# Patient Record
Sex: Male | Born: 1935 | Race: White | Hispanic: No | Marital: Married | Smoking: Former smoker
Health system: Southern US, Community
[De-identification: ages and names within clinical notes are randomized; demographics above are authoritative.]

## PROBLEM LIST (undated history)

## (undated) DIAGNOSIS — N189 Chronic kidney disease, unspecified: Secondary | ICD-10-CM

## (undated) DIAGNOSIS — K922 Gastrointestinal hemorrhage, unspecified: Secondary | ICD-10-CM

## (undated) DIAGNOSIS — E785 Hyperlipidemia, unspecified: Secondary | ICD-10-CM

## (undated) DIAGNOSIS — K4090 Unilateral inguinal hernia, without obstruction or gangrene, not specified as recurrent: Secondary | ICD-10-CM

## (undated) DIAGNOSIS — I2510 Atherosclerotic heart disease of native coronary artery without angina pectoris: Secondary | ICD-10-CM

## (undated) DIAGNOSIS — I498 Other specified cardiac arrhythmias: Secondary | ICD-10-CM

## (undated) DIAGNOSIS — D509 Iron deficiency anemia, unspecified: Secondary | ICD-10-CM

## (undated) HISTORY — DX: Iron deficiency anemia, unspecified: D50.9

## (undated) HISTORY — DX: Hyperlipidemia, unspecified: E78.5

## (undated) HISTORY — DX: Chronic kidney disease, unspecified: N18.9

## (undated) HISTORY — DX: Unilateral inguinal hernia, without obstruction or gangrene, not specified as recurrent: K40.90

## (undated) HISTORY — DX: Atherosclerotic heart disease of native coronary artery without angina pectoris: I25.10

## (undated) HISTORY — DX: Gastrointestinal hemorrhage, unspecified: K92.2

## (undated) HISTORY — DX: Other specified cardiac arrhythmias: I49.8

---

## 2004-04-03 LAB — HM SIGMOIDOSCOPY

## 2006-09-27 ENCOUNTER — Ambulatory Visit (HOSPITAL_COMMUNITY): Admission: RE | Admit: 2006-09-27 | Discharge: 2006-09-27 | Payer: Self-pay | Admitting: Ophthalmology

## 2008-09-01 LAB — HM COLONOSCOPY

## 2010-06-23 ENCOUNTER — Encounter: Payer: Self-pay | Admitting: Family Medicine

## 2010-06-23 DIAGNOSIS — K922 Gastrointestinal hemorrhage, unspecified: Secondary | ICD-10-CM | POA: Insufficient documentation

## 2010-06-23 DIAGNOSIS — K4090 Unilateral inguinal hernia, without obstruction or gangrene, not specified as recurrent: Secondary | ICD-10-CM | POA: Insufficient documentation

## 2010-06-23 DIAGNOSIS — N400 Enlarged prostate without lower urinary tract symptoms: Secondary | ICD-10-CM | POA: Insufficient documentation

## 2010-06-23 DIAGNOSIS — I498 Other specified cardiac arrhythmias: Secondary | ICD-10-CM | POA: Insufficient documentation

## 2010-06-23 DIAGNOSIS — I2510 Atherosclerotic heart disease of native coronary artery without angina pectoris: Secondary | ICD-10-CM

## 2010-06-23 DIAGNOSIS — E785 Hyperlipidemia, unspecified: Secondary | ICD-10-CM

## 2010-06-23 DIAGNOSIS — N189 Chronic kidney disease, unspecified: Secondary | ICD-10-CM

## 2010-06-23 DIAGNOSIS — D509 Iron deficiency anemia, unspecified: Secondary | ICD-10-CM | POA: Insufficient documentation

## 2011-01-18 LAB — BASIC METABOLIC PANEL
BUN: 24 — ABNORMAL HIGH
Calcium: 8.4
Creatinine, Ser: 1.46
GFR calc Af Amer: 57 — ABNORMAL LOW
GFR calc non Af Amer: 47 — ABNORMAL LOW

## 2011-01-18 LAB — HEMOGLOBIN AND HEMATOCRIT, BLOOD
HCT: 30.9 — ABNORMAL LOW
Hemoglobin: 11.1 — ABNORMAL LOW

## 2012-07-22 ENCOUNTER — Encounter: Payer: Self-pay | Admitting: Family Medicine

## 2012-07-22 ENCOUNTER — Ambulatory Visit (INDEPENDENT_AMBULATORY_CARE_PROVIDER_SITE_OTHER): Payer: Medicare Other | Admitting: Family Medicine

## 2012-07-22 ENCOUNTER — Encounter: Payer: Self-pay | Admitting: *Deleted

## 2012-07-22 ENCOUNTER — Ambulatory Visit (INDEPENDENT_AMBULATORY_CARE_PROVIDER_SITE_OTHER): Payer: Medicare Other

## 2012-07-22 VITALS — BP 119/62 | HR 101 | Temp 98.2°F | Ht 68.5 in | Wt 148.0 lb

## 2012-07-22 DIAGNOSIS — E785 Hyperlipidemia, unspecified: Secondary | ICD-10-CM

## 2012-07-22 DIAGNOSIS — A084 Viral intestinal infection, unspecified: Secondary | ICD-10-CM

## 2012-07-22 DIAGNOSIS — D509 Iron deficiency anemia, unspecified: Secondary | ICD-10-CM

## 2012-07-22 DIAGNOSIS — A088 Other specified intestinal infections: Secondary | ICD-10-CM

## 2012-07-22 DIAGNOSIS — N189 Chronic kidney disease, unspecified: Secondary | ICD-10-CM

## 2012-07-22 DIAGNOSIS — R079 Chest pain, unspecified: Secondary | ICD-10-CM

## 2012-07-22 DIAGNOSIS — R7989 Other specified abnormal findings of blood chemistry: Secondary | ICD-10-CM

## 2012-07-22 LAB — POCT CBC
Lymph, poc: 1.3 (ref 0.6–3.4)
MCH, POC: 31.5 pg — AB (ref 27–31.2)
MPV: 8.5 fL (ref 0–99.8)
POC Granulocyte: 9 — AB (ref 2–6.9)
POC LYMPH PERCENT: 11.9 %L (ref 10–50)
Platelet Count, POC: 189 10*3/uL (ref 142–424)
RBC: 4.3 M/uL — AB (ref 4.69–6.13)

## 2012-07-22 LAB — BASIC METABOLIC PANEL WITH GFR
Chloride: 106 mEq/L (ref 96–112)
GFR, Est African American: 36 mL/min — ABNORMAL LOW
GFR, Est Non African American: 31 mL/min — ABNORMAL LOW
Potassium: 4.8 mEq/L (ref 3.5–5.3)
Sodium: 139 mEq/L (ref 135–145)

## 2012-07-22 LAB — HEPATIC FUNCTION PANEL
ALT: 11 U/L (ref 0–53)
Total Protein: 7.4 g/dL (ref 6.0–8.3)

## 2012-07-22 LAB — THYROID PANEL WITH TSH: Free Thyroxine Index: 3.6 (ref 1.0–3.9)

## 2012-07-22 NOTE — Progress Notes (Addendum)
Subjective:    Patient ID: Justin Bowman, male    DOB: [birth year], 79 y.o.   MRN: [id number]  HPI This patient presents for recheck of multiple medical problems. He also complains today of loose bowel movements and abdominal pain for the past 36 hours. He also mentioned he's had some episodes of chest pain lasting for 10 seconds. Chest pain has been going on for several months. No one accompanies the patient today.  Patient Active Problem List  Diagnosis  . hyperlipidemia  . ASCVD (arteriosclerotic cardiovascular disease)  . Other specified cardiac dysrhythmias  . Hemorrhage of gastrointestinal tract, unspecified  . Chronic kidney disease, unspecified  . Iron deficiency anemia  . Hyperplasia of prostate  . Inguinal hernia without mention of obstruction or gangrene, unilateral or unspecified, (not specified as recurrent)    In addition, See Ros  The allergies, current medications, past medical history, surgical history, family and social history are reviewed.  Immunizations reviewed.  Health maintenance reviewed.  The following items are outstanding:None      Review of Systems  Constitutional: Positive for fever (last PM) and fatigue (increased in the last 6 mths).  HENT: Positive for postnasal drip.   Respiratory: Negative.   Gastrointestinal: Positive for nausea, abdominal pain and diarrhea (x 2days). Negative for vomiting.  Musculoskeletal: Negative.   Neurological: Positive for dizziness and weakness. Negative for headaches.  Psychiatric/Behavioral: Positive for sleep disturbance (occasional).   The patient has had loose bowel movements off and on for the past 36 hours. There has been no blood in the bowel movement.     Objective:   Physical Exam  BP 119/62  Pulse 101  Temp(Src) 98.2 F (36.8 C) (Oral)  The patient appeared well nourished and normally developed, alert and oriented to time and place. Speech, behavior and judgement appear normal. Vital signs  as documented.  Head exam is unremarkable. No scleral icterus or pallor noted. Slight nasal congestion bilaterally. Neck is without jugular venous distension, thyromegally, or carotid bruits. Carotid upstrokes are brisk bilaterally. No cervical adenopathy. Lungs are clear anteriorly and posteriorly to auscultation. Normal respiratory effort. Cardiac exam reveals regular rate and rhythm @ 60/min. First and second heart sounds normal. No murmurs, rubs or gallops.  Abdominal exam reveals normal bowl sounds, no masses, no organomegaly and no aortic enlargement. The abdomen was tympanic especially in the epigastric area.  No inguinal adenopathy. Extremities are nonedematous and both femoral and pedal pulses are normal. Skin without pallor or jaundice.  Warm and dry, without rash. Neurologic exam reveals normal deep tendon reflexes and normal sensation.  [facility] reading (PRIMARY) by  Dr. Vernon Prey: Degenerative and emphysematous changes on chest x-ray                                      Assessment & Plan:  1. Elevated TSH - Thyroid Panel With TSH  2. Chronic kidney disease, unspecified - BASIC METABOLIC PANEL WITH GFR  3. Iron deficiency anemia - POCT CBC  4. Hyperlipemia - Hepatic function panel  5. Chest pain - DG Chest 2 View; Future  6. Viral gastroenteritis --CBC   Patient Instructions  Continue current meds and therapeutic lifestyle changes  Clear liquids for 24 hours (like 7-Up, ginger ale, Sprite, Jello, frozen pops) Full liquids the second 24-hours (like potato soup, tomato soup, chicken noodle soup) Bland diet the third 24-hours (boiled and baked foods, no fried  or greasy foods) Avoid milk, cheese, ice cream and dairy products for 72 hours. Avoid caffeine (cola drinks, coffee, tea, Mountain Dew, Mellow Yellow) Take in small amounts, but frequently. Tylenol and/or Advil as needed for aches pains and fever  REST!!!

## 2012-07-22 NOTE — Patient Instructions (Addendum)
Continue current meds and therapeutic lifestyle changes  Clear liquids for 24 hours (like 7-Up, ginger ale, Sprite, Jello, frozen pops) Full liquids the second 24-hours (like potato soup, tomato soup, chicken noodle soup) Bland diet the third 24-hours (boiled and baked foods, no fried or greasy foods) Avoid milk, cheese, ice cream and dairy products for 72 hours. Avoid caffeine (cola drinks, coffee, tea, Mountain Dew, Mellow Yellow) Take in small amounts, but frequently. Tylenol and/or Advil as needed for aches pains and fever  REST!!!

## 2012-07-30 ENCOUNTER — Ambulatory Visit (INDEPENDENT_AMBULATORY_CARE_PROVIDER_SITE_OTHER): Payer: Medicare Other | Admitting: Pharmacist Clinician (PhC)/ Clinical Pharmacy Specialist

## 2012-07-30 DIAGNOSIS — I4891 Unspecified atrial fibrillation: Secondary | ICD-10-CM

## 2012-07-30 LAB — POCT INR: INR: 2.1

## 2012-09-10 ENCOUNTER — Ambulatory Visit (INDEPENDENT_AMBULATORY_CARE_PROVIDER_SITE_OTHER): Payer: Medicare Other | Admitting: Pharmacist Clinician (PhC)/ Clinical Pharmacy Specialist

## 2012-09-10 DIAGNOSIS — I4891 Unspecified atrial fibrillation: Secondary | ICD-10-CM

## 2012-09-10 LAB — POCT INR: INR: 1.6

## 2012-10-15 ENCOUNTER — Ambulatory Visit (INDEPENDENT_AMBULATORY_CARE_PROVIDER_SITE_OTHER): Payer: Medicare Other | Admitting: Pharmacist Clinician (PhC)/ Clinical Pharmacy Specialist

## 2012-10-15 DIAGNOSIS — I4891 Unspecified atrial fibrillation: Secondary | ICD-10-CM

## 2012-10-15 LAB — POCT INR: INR: 3

## 2012-11-19 ENCOUNTER — Ambulatory Visit (INDEPENDENT_AMBULATORY_CARE_PROVIDER_SITE_OTHER): Payer: Medicare Other | Admitting: Pharmacist Clinician (PhC)/ Clinical Pharmacy Specialist

## 2012-11-19 DIAGNOSIS — I4891 Unspecified atrial fibrillation: Secondary | ICD-10-CM

## 2012-11-19 LAB — POCT INR: INR: 2

## 2012-11-25 ENCOUNTER — Ambulatory Visit (INDEPENDENT_AMBULATORY_CARE_PROVIDER_SITE_OTHER): Payer: Medicare Other | Admitting: Family Medicine

## 2012-11-25 ENCOUNTER — Encounter: Payer: Self-pay | Admitting: Family Medicine

## 2012-11-25 VITALS — BP 136/79 | HR 82 | Temp 97.2°F | Ht 68.5 in | Wt 144.0 lb

## 2012-11-25 DIAGNOSIS — I4891 Unspecified atrial fibrillation: Secondary | ICD-10-CM

## 2012-11-25 DIAGNOSIS — D509 Iron deficiency anemia, unspecified: Secondary | ICD-10-CM

## 2012-11-25 DIAGNOSIS — H6123 Impacted cerumen, bilateral: Secondary | ICD-10-CM

## 2012-11-25 DIAGNOSIS — E785 Hyperlipidemia, unspecified: Secondary | ICD-10-CM

## 2012-11-25 DIAGNOSIS — I10 Essential (primary) hypertension: Secondary | ICD-10-CM

## 2012-11-25 DIAGNOSIS — E559 Vitamin D deficiency, unspecified: Secondary | ICD-10-CM

## 2012-11-25 DIAGNOSIS — K5900 Constipation, unspecified: Secondary | ICD-10-CM

## 2012-11-25 DIAGNOSIS — H6120 Impacted cerumen, unspecified ear: Secondary | ICD-10-CM

## 2012-11-25 LAB — POCT CBC
Hemoglobin: 12.9 g/dL — AB (ref 14.1–18.1)
Lymph, poc: 1.6 (ref 0.6–3.4)
MCHC: 33.1 g/dL (ref 31.8–35.4)
MPV: 7.6 fL (ref 0–99.8)
POC Granulocyte: 5.5 (ref 2–6.9)
Platelet Count, POC: 175 10*3/uL (ref 142–424)

## 2012-11-25 NOTE — Patient Instructions (Addendum)
Fall precautions discussed Continue current meds and therapeutic lifestyle changes Return to clinic for a flu shot in September or October Return FOBT  Use MiraLax over-the-counter for constipation, continue current stool softener and drink plenty of water Use debroxl ear drops over-the-counter for 2-3 nights in a row. Use 2-3 drops in each ear. Wait one week and repeat. Return to clinic in about 3 weeks for ear irrigation

## 2012-11-25 NOTE — Progress Notes (Signed)
Subjective:    Patient ID: Justin Bowman, male    DOB: 1925-11-01, 79 y.o.   MRN: [id number]  HPI The patient returns to clinic today for followup and management of chronic medical problems. These problems include ASCVD, atrial fibrillation, hypertension, hyperlipidemia, and anemia. He is followed by the cardiologist at [facility] twice yearly. Also see the review of systems . He recently had a cataract removed a couple of weeks ago. He is also complaining with more discomfort with the right inguinal hernia for which he is seeing a Careers adviser at [facility] in the past.   Review of Systems  Constitutional: Positive for fatigue. Negative for activity change and appetite change.  HENT: Negative for ear pain, congestion, sore throat, rhinorrhea, postnasal drip and sinus pressure.   Eyes: Negative.  Negative for pain, discharge, redness, itching and visual disturbance.  Respiratory: Negative.  Negative for cough, choking, chest tightness, shortness of breath and wheezing.   Cardiovascular: Negative.  Negative for chest pain, palpitations and leg swelling.  Gastrointestinal: Positive for constipation. Negative for nausea, vomiting, abdominal pain, diarrhea and blood in stool.  Endocrine: Negative.  Negative for cold intolerance, heat intolerance, polydipsia, polyphagia and polyuria.  Genitourinary: Negative.  Negative for dysuria, urgency, frequency, hematuria and testicular pain.  Musculoskeletal: Negative.  Negative for myalgias, back pain and arthralgias.  Skin: Negative.  Negative for color change, pallor, rash and wound.  Allergic/Immunologic: Negative.   Neurological: Negative for tremors, weakness, light-headedness, numbness and headaches.  Psychiatric/Behavioral: Positive for decreased concentration (slight memory deficit). Negative for confusion, sleep disturbance and agitation. The patient is not nervous/anxious.        Objective:   Physical Exam BP 136/79   Pulse 82  Temp(Src) 97.2 F (36.2 C) (Oral)  Ht 5' 8.5" (1.74 m)  Wt 144 lb (65.318 kg)  BMI 21.57 kg/m2  The patient appeared well nourished and normally developed for his age, alert and oriented to time and place. Speech, behavior and judgement appear normal. Vital signs as documented.  Head exam is unremarkable. No scleral icterus or pallor noted. There was air cerumen bilaterally.  Neck is without jugular venous distension, thyromegally, or carotid bruits. Carotid upstrokes are brisk bilaterally. No cervical adenopathy. Lungs are clear anteriorly and posteriorly to auscultation. Normal respiratory effort. Cardiac exam reveals regular rate and rhythm at 64 per minute. First and second heart sounds normal.  No murmurs, rubs or gallops.  Abdominal exam reveals normal bowl sounds, no masses, no organomegaly and no aortic enlargement. No inguinal adenopathy. There is no abdominal tenderness. There was a right inguinal hernia which he has had for some time and when laying down it was reducible.  Extremities are nonedematous and the right femoral and pedal pulses are normal. The left femoral and pedal pulses were diminished. Skin without pallor or jaundice.  Warm and dry, without rash. Neurologic exam reveals normal deep tendon reflexes and normal sensation.          Assessment & Plan:  1. Hypertension - POCT CBC; Standing - BMP8+EGFR - POCT CBC  2. Hyperlipemia - Hepatic function panel; Standing - NMR, lipoprofile; Standing - Hepatic function panel - NMR, lipoprofile  3. Atrial fibrillation  4. Iron deficiency anemia - POCT CBC; Standing - POCT CBC  5. Vitamin D deficiency - Vit D  25 hydroxy (rtn osteoporosis monitoring); Standing - Vit D  25 hydroxy (rtn osteoporosis monitoring)  6. Excessive cerumen in ear canal, bilateral  7. Constipation  Patient Instructions  Fall precautions discussed Continue current meds and therapeutic lifestyle changes Return to clinic  for a flu shot in September or October Return FOBT  Use MiraLax over-the-counter for constipation, continue current stool softener and drink plenty of water Use debroxl ear drops over-the-counter for 2-3 nights in a row. Use 2-3 drops in each ear. Wait one week and repeat. Return to clinic in about 3 weeks for ear irrigation   Nyra Capes MD

## 2012-11-27 ENCOUNTER — Ambulatory Visit (INDEPENDENT_AMBULATORY_CARE_PROVIDER_SITE_OTHER): Payer: Medicare Other | Admitting: Family Medicine

## 2012-11-27 ENCOUNTER — Encounter: Payer: Self-pay | Admitting: Family Medicine

## 2012-11-27 VITALS — BP 119/65 | HR 81 | Temp 97.3°F | Ht 68.5 in | Wt 144.0 lb

## 2012-11-27 DIAGNOSIS — H6123 Impacted cerumen, bilateral: Secondary | ICD-10-CM

## 2012-11-27 DIAGNOSIS — H6120 Impacted cerumen, unspecified ear: Secondary | ICD-10-CM

## 2012-11-27 LAB — BMP8+EGFR
Calcium: 9.1 mg/dL (ref 8.6–10.2)
Creatinine, Ser: 1.53 mg/dL — ABNORMAL HIGH (ref 0.76–1.27)
GFR calc non Af Amer: 41 mL/min/{1.73_m2} — ABNORMAL LOW (ref >59)
Glucose: 90 mg/dL (ref 65–99)

## 2012-11-27 LAB — NMR, LIPOPROFILE
Cholesterol: 126 mg/dL (ref <200)
LDL Particle Number: 1070 nmol/L — ABNORMAL HIGH (ref <1000)
LDL Size: 20.5 nm — ABNORMAL LOW (ref >20.5)
Small LDL Particle Number: 720 nmol/L — ABNORMAL HIGH (ref <=527)
Triglycerides by NMR: 106 mg/dL (ref <150)

## 2012-11-27 LAB — HEPATIC FUNCTION PANEL
Bilirubin, Direct: 0.1 mg/dL (ref 0.00–0.40)
Total Bilirubin: 0.2 mg/dL (ref 0.0–1.2)
Total Protein: 7.3 g/dL (ref 6.0–8.5)

## 2012-11-27 NOTE — Progress Notes (Signed)
  Subjective:    Patient ID: Justin Bowman, male    DOB: 09/06/1925, 79 y.o.   MRN: [id number]  HPI Patient returns to clinic today after using debrox drops in both ears. He only used the drops for a couple of days but was having so much discomfort he came in earlier than expected to have that ears irrigated. He was also having some dizziness.   Review of Systems  Constitutional: Negative.   HENT: Positive for ear pain (bilateral). Negative for hearing loss.   Eyes: Negative.   Respiratory: Negative.   Cardiovascular: Negative.   Gastrointestinal: Negative.   Endocrine: Negative.   Genitourinary: Negative.   Musculoskeletal: Negative.   Skin: Negative.   Neurological: Positive for dizziness.  Psychiatric/Behavioral: Negative.        Objective:   Physical Exam Ears cerumen bilaterally was irrigated successfully with water and alcohol . A very large clump of cerumen was removed from the right ear canal       Assessment & Plan:

## 2012-11-27 NOTE — Patient Instructions (Signed)
Continue to use Debrox periodically in the ears to keep wax softened

## 2012-12-04 ENCOUNTER — Telehealth: Payer: Self-pay | Admitting: Family Medicine

## 2012-12-05 NOTE — Telephone Encounter (Signed)
Patient aware.

## 2012-12-25 ENCOUNTER — Ambulatory Visit: Payer: Medicare Other | Admitting: Family Medicine

## 2013-01-01 ENCOUNTER — Ambulatory Visit (INDEPENDENT_AMBULATORY_CARE_PROVIDER_SITE_OTHER): Payer: Medicare Other

## 2013-01-01 DIAGNOSIS — Z23 Encounter for immunization: Secondary | ICD-10-CM

## 2013-01-07 ENCOUNTER — Ambulatory Visit (INDEPENDENT_AMBULATORY_CARE_PROVIDER_SITE_OTHER): Payer: Medicare Other | Admitting: Pharmacist Clinician (PhC)/ Clinical Pharmacy Specialist

## 2013-01-07 DIAGNOSIS — I4891 Unspecified atrial fibrillation: Secondary | ICD-10-CM

## 2013-01-07 LAB — POCT INR: INR: 1.9

## 2013-02-18 ENCOUNTER — Ambulatory Visit (INDEPENDENT_AMBULATORY_CARE_PROVIDER_SITE_OTHER): Payer: Medicare Other | Admitting: Pharmacist Clinician (PhC)/ Clinical Pharmacy Specialist

## 2013-02-18 DIAGNOSIS — I4891 Unspecified atrial fibrillation: Secondary | ICD-10-CM

## 2013-02-18 LAB — POCT INR: INR: 2.6

## 2013-03-01 ENCOUNTER — Other Ambulatory Visit: Payer: Self-pay | Admitting: Family Medicine

## 2013-03-04 ENCOUNTER — Other Ambulatory Visit: Payer: Self-pay | Admitting: Pharmacist Clinician (PhC)/ Clinical Pharmacy Specialist

## 2013-03-04 MED ORDER — WARFARIN SODIUM 5 MG PO TABS: 5.0000 mg | ORAL_TABLET | Freq: Every day | ORAL | Status: DC

## 2013-04-08 ENCOUNTER — Ambulatory Visit (INDEPENDENT_AMBULATORY_CARE_PROVIDER_SITE_OTHER): Payer: Medicare Other | Admitting: Pharmacist Clinician (PhC)/ Clinical Pharmacy Specialist

## 2013-04-08 DIAGNOSIS — I4891 Unspecified atrial fibrillation: Secondary | ICD-10-CM

## 2013-04-08 LAB — POCT INR: INR: 1.6

## 2013-04-11 ENCOUNTER — Ambulatory Visit (INDEPENDENT_AMBULATORY_CARE_PROVIDER_SITE_OTHER): Payer: Medicare Other | Admitting: Family Medicine

## 2013-04-11 ENCOUNTER — Encounter: Payer: Self-pay | Admitting: Family Medicine

## 2013-04-11 VITALS — BP 135/71 | HR 85 | Temp 97.8°F | Ht 68.5 in | Wt 145.0 lb

## 2013-04-11 DIAGNOSIS — R42 Dizziness and giddiness: Secondary | ICD-10-CM

## 2013-04-11 MED ORDER — HYDROCORTISONE ACETATE 25 MG RE SUPP: 25.0000 mg | Freq: Two times a day (BID) | RECTAL | Status: DC

## 2013-04-11 MED ORDER — MECLIZINE HCL 25 MG PO TABS: 25.0000 mg | ORAL_TABLET | Freq: Three times a day (TID) | ORAL | Status: DC | PRN

## 2013-04-11 NOTE — Progress Notes (Signed)
Ears irrigated until clear.

## 2013-04-11 NOTE — Patient Instructions (Addendum)
Vertigo Vertigo means you feel like you or your surroundings are moving when they are not. Vertigo can be dangerous if it occurs when you are at work, driving, or performing difficult activities.  CAUSES  Vertigo occurs when there is a conflict of signals sent to your brain from the visual and sensory systems in your body. There are many different causes of vertigo, including:  Infections, especially in the inner ear.  A bad reaction to a drug or misuse of alcohol and medicines.  Withdrawal from drugs or alcohol.  Rapidly changing positions, such as lying down or rolling over in bed.  A migraine headache.  Decreased blood flow to the brain.  Increased pressure in the brain from a head injury, infection, tumor, or bleeding. SYMPTOMS  You may feel as though the world is spinning around or you are falling to the ground. Because your balance is upset, vertigo can cause nausea and vomiting. You may have involuntary eye movements (nystagmus). DIAGNOSIS  Vertigo is usually diagnosed by physical exam. If the cause of your vertigo is unknown, your caregiver may perform imaging tests, such as an MRI scan (magnetic resonance imaging). TREATMENT  Most cases of vertigo resolve on their own, without treatment. Depending on the cause, your caregiver may prescribe certain medicines. If your vertigo is related to body position issues, your caregiver may recommend movements or procedures to correct the problem. In rare cases, if your vertigo is caused by certain inner ear problems, you may need surgery. HOME CARE INSTRUCTIONS   Follow your caregiver's instructions.  Avoid driving.  Avoid operating heavy machinery.  Avoid performing any tasks that would be dangerous to you or others during a vertigo episode.  Tell your caregiver if you notice that certain medicines seem to be causing your vertigo. Some of the medicines used to treat vertigo episodes can actually make them worse in some people. SEEK  IMMEDIATE MEDICAL CARE IF:   Your medicines do not relieve your vertigo or are making it worse.  You develop problems with talking, walking, weakness, or using your arms, hands, or legs.  You develop severe headaches.  Your nausea or vomiting continues or gets worse.  You develop visual changes.  A family member notices behavioral changes.  Your condition gets worse. MAKE SURE YOU:  Understand these instructions.  Will watch your condition.  Will get help right away if you are not doing well or get worse. Document Released: 12/28/2004 Document Revised: 06/12/2011 Document Reviewed: 10/06/2010 [facility] CenterExitCare Patient Information 2014 [place]ExitCare, MarylandLLC.   Instructed patient to use wax softening drops several times a month before taking a shower.

## 2013-04-11 NOTE — Progress Notes (Signed)
   Subjective:    Patient ID: Justin Bowman, male    DOB: 14-Jun-1925, 80 y.o.   MRN: [id number]  HPI  This 80 y.o. male presents for evaluation of dizziness whenever moving head for one day.  Review of Systems C/o dizziness.   No chest pain, SOB, HA,  vision change, N/V, diarrhea, constipation, dysuria, urinary urgency or frequency, myalgias, arthralgias or rash.  Objective:   Physical Exam Vital signs noted  Well developed well nourished male.  HEENT - Head atraumatic Normocephalic                Eyes - PERRLA, Conjuctiva - clear Sclera- Clear EOMI                Ears - EAC's Wnl TM's Wnl Gross Hearing WNL                Throat - oropharanx wnl Respiratory - Lungs CTA bilateral Cardiac - RRR S1 and S2 without murmur GI - Abdomen soft Nontender and bowel sounds active x 4 Extremities - No edema. Neuro - Grossly intact.     BP standing 120/70 heart rate 80 Assessment & Plan:  Vertigo - Plan: meclizine (ANTIVERT) 25 MG tablet Push po fluids, rest, tylenol and motrin otc prn as directed for fever, arthralgias, and myalgias.  Follow up prn if sx's continue or persist.  Deatra CanterWilliam J Anah Billard FNP

## 2013-05-02 IMAGING — CR DG CHEST 2V
2 series · 2 of 2 positions shown · non-contrast
Comparison: None.

CLINICAL DATA: Chest pain

CHEST - 2 VIEW

[view not recorded (1 of 2)]
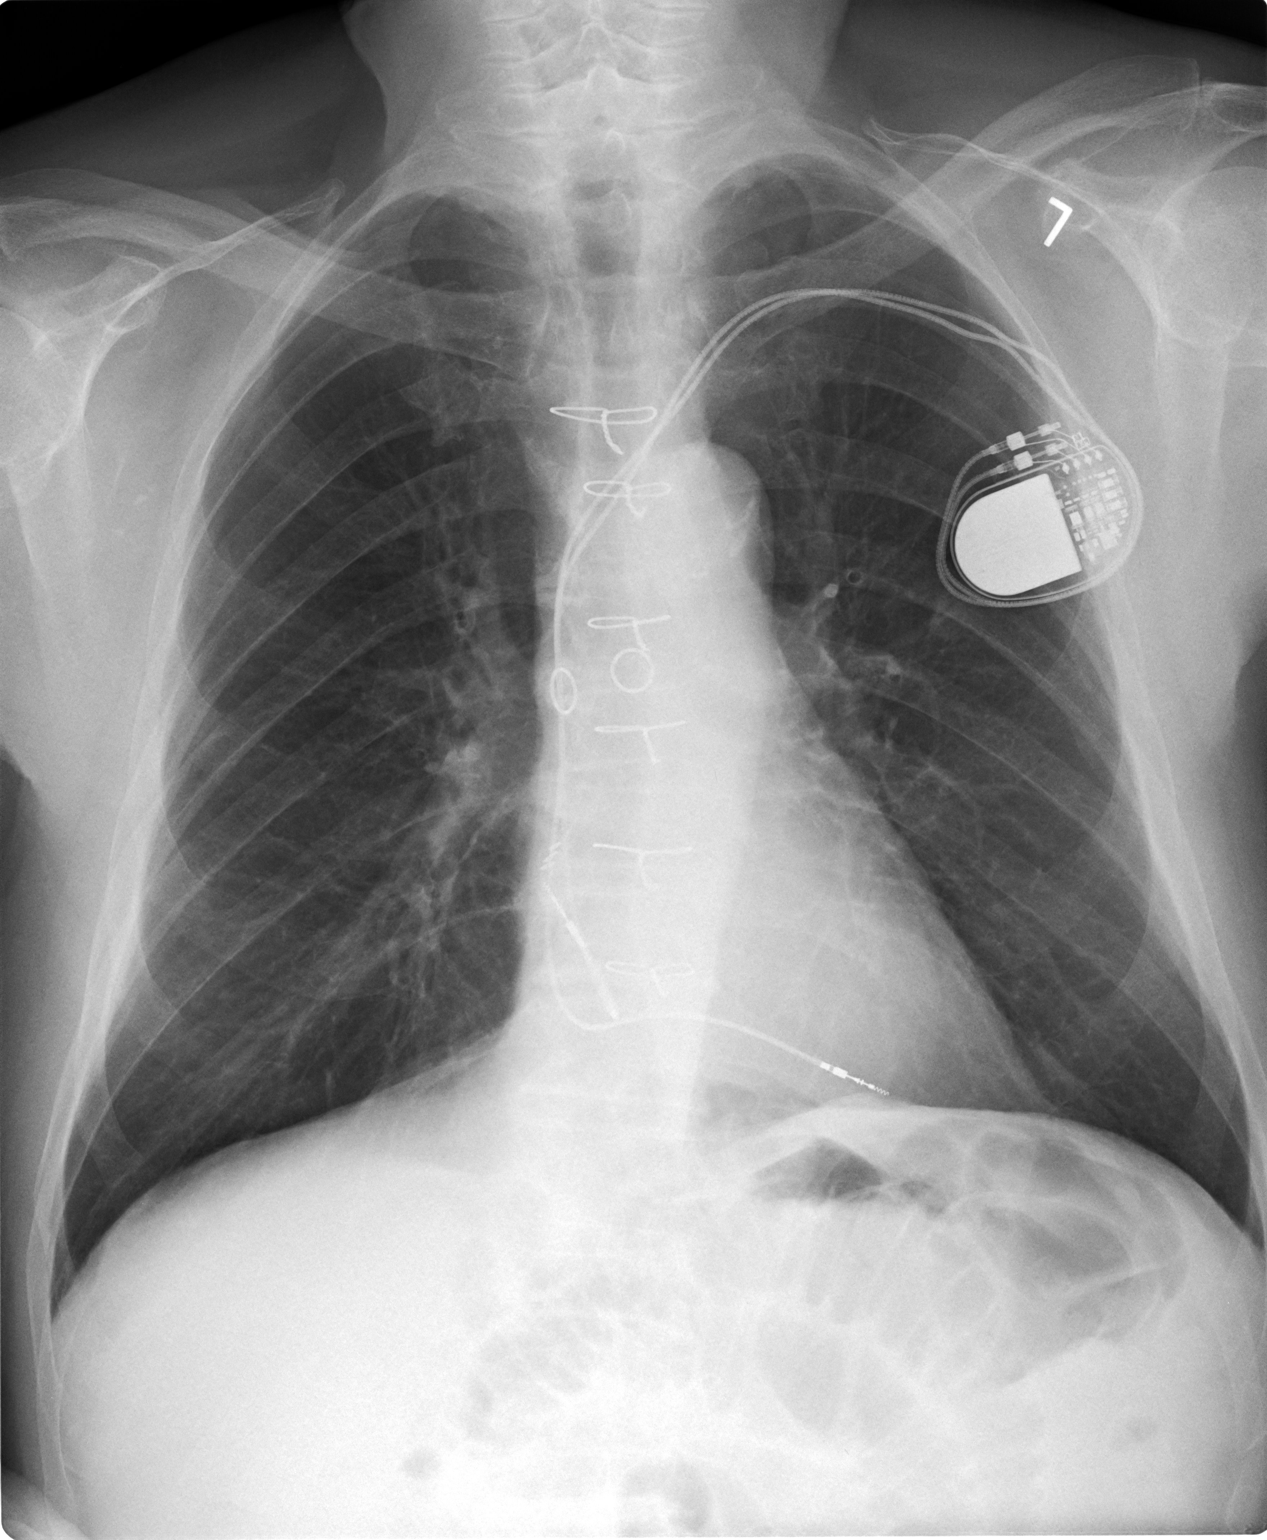

[view not recorded (2 of 2)]
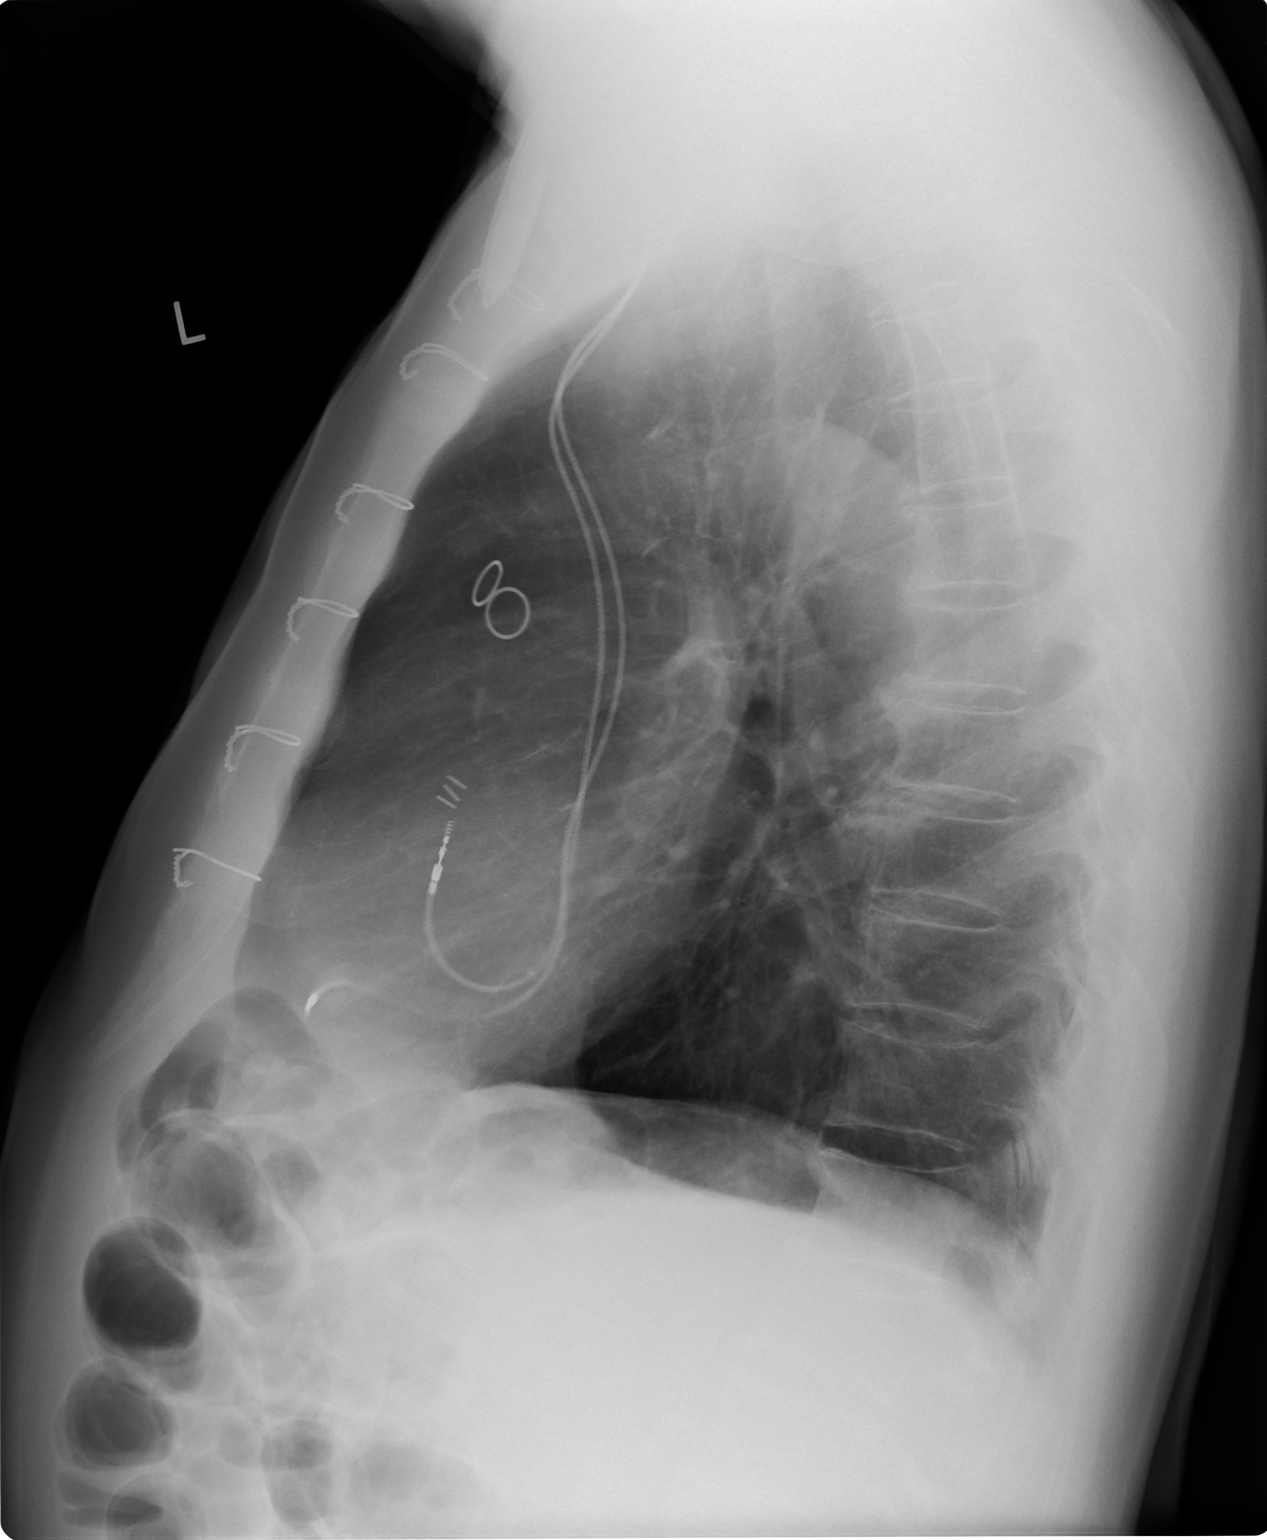

[2 of 2 positions shown; findings below may reference images not displayed]

FINDINGS: Left-sided pacemaker overlies stable cardiac silhouette.
No effusion, infiltrate, or pneumothorax.  Lungs are hyperinflated.
No acute osseous abnormality.
IMPRESSION: 1. No acute cardiopulmonary process..
2.  Emphysematous change.

## 2013-05-07 ENCOUNTER — Encounter: Payer: Self-pay | Admitting: *Deleted

## 2013-05-21 ENCOUNTER — Ambulatory Visit (INDEPENDENT_AMBULATORY_CARE_PROVIDER_SITE_OTHER): Payer: Medicare Other | Admitting: Pharmacist

## 2013-05-21 DIAGNOSIS — I4891 Unspecified atrial fibrillation: Secondary | ICD-10-CM

## 2013-05-21 LAB — POCT INR: INR: 3.3

## 2013-05-21 NOTE — Patient Instructions (Signed)
Anticoagulation Dose Instructions as of 05/21/2013     Glynis SmilesSun Mon Tue Wed Thu Fri Sat   New Dose 5 mg 2.5 mg 5 mg 2.5 mg 5 mg 2.5 mg 2.5 mg    Description       Hold warfarin tomorrow, then restart regular schedule of 1 tablet sundays, tuesdays and thursdays and 1/2 tablet all other days.      INR was 3.3 today

## 2013-06-10 ENCOUNTER — Ambulatory Visit (INDEPENDENT_AMBULATORY_CARE_PROVIDER_SITE_OTHER): Payer: Medicare Other | Admitting: Pharmacist Clinician (PhC)/ Clinical Pharmacy Specialist

## 2013-06-10 DIAGNOSIS — I4891 Unspecified atrial fibrillation: Secondary | ICD-10-CM

## 2013-06-10 LAB — POCT INR: INR: 2.8

## 2013-07-01 ENCOUNTER — Ambulatory Visit (INDEPENDENT_AMBULATORY_CARE_PROVIDER_SITE_OTHER): Payer: Medicare Other | Admitting: Pharmacist Clinician (PhC)/ Clinical Pharmacy Specialist

## 2013-07-01 DIAGNOSIS — I4891 Unspecified atrial fibrillation: Secondary | ICD-10-CM

## 2013-07-01 LAB — POCT INR: INR: 3.1

## 2013-07-22 ENCOUNTER — Ambulatory Visit (INDEPENDENT_AMBULATORY_CARE_PROVIDER_SITE_OTHER): Payer: Medicare Other | Admitting: Pharmacist Clinician (PhC)/ Clinical Pharmacy Specialist

## 2013-07-22 DIAGNOSIS — I4891 Unspecified atrial fibrillation: Secondary | ICD-10-CM

## 2013-07-22 LAB — POCT INR: INR: 3.4

## 2013-08-12 ENCOUNTER — Ambulatory Visit (INDEPENDENT_AMBULATORY_CARE_PROVIDER_SITE_OTHER): Payer: Medicare Other | Admitting: Pharmacist Clinician (PhC)/ Clinical Pharmacy Specialist

## 2013-08-12 DIAGNOSIS — I4891 Unspecified atrial fibrillation: Secondary | ICD-10-CM

## 2013-08-12 LAB — POCT INR: INR: 1.9

## 2013-09-02 ENCOUNTER — Ambulatory Visit (INDEPENDENT_AMBULATORY_CARE_PROVIDER_SITE_OTHER): Payer: Medicare Other | Admitting: Pharmacist Clinician (PhC)/ Clinical Pharmacy Specialist

## 2013-09-02 DIAGNOSIS — I4891 Unspecified atrial fibrillation: Secondary | ICD-10-CM

## 2013-09-02 LAB — POCT INR: INR: 1.4

## 2013-09-16 ENCOUNTER — Ambulatory Visit (INDEPENDENT_AMBULATORY_CARE_PROVIDER_SITE_OTHER): Payer: Medicare Other | Admitting: Pharmacist Clinician (PhC)/ Clinical Pharmacy Specialist

## 2013-09-16 DIAGNOSIS — I4891 Unspecified atrial fibrillation: Secondary | ICD-10-CM

## 2013-09-16 LAB — POCT INR: INR: 2.7

## 2013-10-14 ENCOUNTER — Ambulatory Visit (INDEPENDENT_AMBULATORY_CARE_PROVIDER_SITE_OTHER): Payer: Medicare Other | Admitting: Pharmacist

## 2013-10-14 VITALS — BP 144/64

## 2013-10-14 DIAGNOSIS — I4891 Unspecified atrial fibrillation: Secondary | ICD-10-CM

## 2013-10-14 LAB — POCT INR: INR: 3.6

## 2013-10-14 NOTE — Patient Instructions (Signed)
Anticoagulation Dose Instructions as of 10/14/2013     Justin SmilesSun Mon Tue Wed Thu Fri Sat   New Dose 2.5 mg 5 mg 2.5 mg 2.5 mg 2.5 mg 5 mg 2.5 mg    Description       No warfarin today, then decrease to 1 tablet on Mondays and Friday and 1/2 tablet all other days.       INR was 3.6 today (too thin)

## 2013-11-11 ENCOUNTER — Ambulatory Visit (INDEPENDENT_AMBULATORY_CARE_PROVIDER_SITE_OTHER): Payer: Medicare Other | Admitting: Pharmacist Clinician (PhC)/ Clinical Pharmacy Specialist

## 2013-11-11 DIAGNOSIS — I4891 Unspecified atrial fibrillation: Secondary | ICD-10-CM

## 2013-11-11 LAB — POCT INR: INR: 3.4

## 2013-12-30 ENCOUNTER — Ambulatory Visit (INDEPENDENT_AMBULATORY_CARE_PROVIDER_SITE_OTHER): Payer: Medicare Other | Admitting: Pharmacist Clinician (PhC)/ Clinical Pharmacy Specialist

## 2013-12-30 DIAGNOSIS — Z23 Encounter for immunization: Secondary | ICD-10-CM

## 2013-12-30 DIAGNOSIS — I4891 Unspecified atrial fibrillation: Secondary | ICD-10-CM

## 2013-12-30 LAB — POCT INR: INR: 1.5

## 2013-12-30 NOTE — Patient Instructions (Signed)
Anticoagulation Dose Instructions as of 12/30/2013     Justin SmilesSun Mon Tue Wed Thu Fri Sat   New Dose 2.5 mg 2.5 mg 5 mg 2.5 mg 2.5 mg 2.5 mg 2.5 mg    Description

## 2014-01-27 ENCOUNTER — Ambulatory Visit (INDEPENDENT_AMBULATORY_CARE_PROVIDER_SITE_OTHER): Payer: Medicare Other | Admitting: Pharmacist Clinician (PhC)/ Clinical Pharmacy Specialist

## 2014-01-27 DIAGNOSIS — I4891 Unspecified atrial fibrillation: Secondary | ICD-10-CM

## 2014-01-27 LAB — POCT INR: INR: 1.5

## 2014-01-27 NOTE — Patient Instructions (Signed)
Anticoagulation Dose Instructions as of 01/27/2014     Glynis SmilesSun Mon Tue Wed Thu Fri Sat   New Dose 2.5 mg 2.5 mg 5 mg 2.5 mg 2.5 mg 5 mg 2.5 mg    Description       Take 1 1/2 tablet today then take 1 tablet Tuesdays and Fridays and 1/2 tablet all other days of the week

## 2014-02-10 ENCOUNTER — Ambulatory Visit (INDEPENDENT_AMBULATORY_CARE_PROVIDER_SITE_OTHER): Payer: Medicare Other | Admitting: Pharmacist Clinician (PhC)/ Clinical Pharmacy Specialist

## 2014-02-10 DIAGNOSIS — I4891 Unspecified atrial fibrillation: Secondary | ICD-10-CM

## 2014-02-10 LAB — POCT INR: INR: 2.3

## 2014-03-30 ENCOUNTER — Ambulatory Visit (INDEPENDENT_AMBULATORY_CARE_PROVIDER_SITE_OTHER): Payer: Medicare Other | Admitting: Physician Assistant

## 2014-03-30 ENCOUNTER — Encounter: Payer: Self-pay | Admitting: Physician Assistant

## 2014-03-30 VITALS — BP 123/69 | HR 93 | Temp 97.3°F | Ht 68.5 in | Wt 144.0 lb

## 2014-03-30 DIAGNOSIS — H6123 Impacted cerumen, bilateral: Secondary | ICD-10-CM

## 2014-03-30 NOTE — Patient Instructions (Signed)
Cerumen Impaction °A cerumen impaction is when the wax in your ear forms a plug. This plug usually causes reduced hearing. Sometimes it also causes an earache or dizziness. Removing a cerumen impaction can be difficult and painful. The wax sticks to the ear canal. The canal is sensitive and bleeds easily. If you try to remove a heavy wax buildup with a cotton tipped swab, you may push it in further. °Irrigation with water, suction, and small ear curettes may be used to clear out the wax. If the impaction is fixed to the skin in the ear canal, ear drops may be needed for a few days to loosen the wax. People who build up a lot of wax frequently can use ear wax removal products available in your local drugstore. °SEEK MEDICAL CARE IF:  °You develop an earache, increased hearing loss, or marked dizziness. °Document Released: 04/27/2004 Document Revised: 06/12/2011 Document Reviewed: 06/17/2009 °ExitCare® Patient Information ©2015 [employer]. This information is not intended to replace advice given to you by your health care provider. Make sure you discuss any questions you have with your health care provider. ° °

## 2014-03-30 NOTE — Progress Notes (Signed)
Subjective:     Patient ID: Justin Bowman, male   DOB: January 11, 1926, [age] y.o.   MRN: [id number]  HPI Pt with decrease in hearing to both ears R>L Daughter tried to irrigate his ears out at home yest Sx got worse Denies any pain or drainage from the ears  Review of Systems     Objective:   Physical Exam Bilat ear canals impacted with cerumen Slight irritation noted to the canal before irrigation Both ears irrigated with removal of cerumen Following irrigation TM's Nl    Assessment:     Cerumen Impaction    Plan:     No Q tip use OTC wax softener monthly F/U prn

## 2014-03-31 ENCOUNTER — Ambulatory Visit (INDEPENDENT_AMBULATORY_CARE_PROVIDER_SITE_OTHER): Payer: Medicare Other | Admitting: Pharmacist Clinician (PhC)/ Clinical Pharmacy Specialist

## 2014-03-31 DIAGNOSIS — I4891 Unspecified atrial fibrillation: Secondary | ICD-10-CM

## 2014-03-31 LAB — POCT INR: INR: 2.2

## 2014-05-05 ENCOUNTER — Ambulatory Visit (INDEPENDENT_AMBULATORY_CARE_PROVIDER_SITE_OTHER): Payer: Medicare Other | Admitting: Pharmacist Clinician (PhC)/ Clinical Pharmacy Specialist

## 2014-05-05 DIAGNOSIS — I4891 Unspecified atrial fibrillation: Secondary | ICD-10-CM

## 2014-05-05 LAB — POCT INR: INR: 2

## 2014-05-05 NOTE — Patient Instructions (Signed)
Anticoagulation Dose Instructions as of 05/05/2014      Justin SmilesSun Mon Tue Wed Thu Fri Sat   New Dose 2.5 mg 2.5 mg 5 mg 2.5 mg 2.5 mg 5 mg 2.5 mg    Description         1 tablet Tuesdays and Fridays and 1/2 tablet all other days of the week

## 2014-05-26 ENCOUNTER — Other Ambulatory Visit: Payer: Self-pay

## 2014-05-26 MED ORDER — WARFARIN SODIUM 5 MG PO TABS: 5.0000 mg | ORAL_TABLET | Freq: Every day | ORAL | Status: DC

## 2014-06-16 ENCOUNTER — Ambulatory Visit (INDEPENDENT_AMBULATORY_CARE_PROVIDER_SITE_OTHER): Payer: Medicare Other | Admitting: Pharmacist Clinician (PhC)/ Clinical Pharmacy Specialist

## 2014-06-16 DIAGNOSIS — I4891 Unspecified atrial fibrillation: Secondary | ICD-10-CM | POA: Diagnosis not present

## 2014-06-16 LAB — POCT INR: INR: 3.1

## 2014-07-02 ENCOUNTER — Other Ambulatory Visit: Payer: Self-pay | Admitting: *Deleted

## 2014-07-02 MED ORDER — WARFARIN SODIUM 2.5 MG PO TABS: 2.5000 mg | ORAL_TABLET | Freq: Every day | ORAL | Status: DC

## 2014-07-28 ENCOUNTER — Ambulatory Visit (INDEPENDENT_AMBULATORY_CARE_PROVIDER_SITE_OTHER): Payer: Medicare Other | Admitting: Pharmacist Clinician (PhC)/ Clinical Pharmacy Specialist

## 2014-07-28 DIAGNOSIS — I4891 Unspecified atrial fibrillation: Secondary | ICD-10-CM

## 2014-07-28 LAB — POCT INR: INR: 3

## 2014-08-13 ENCOUNTER — Telehealth: Payer: Self-pay | Admitting: Family Medicine

## 2014-08-13 NOTE — Telephone Encounter (Signed)
Appointment given with Dr. Darlyn ReadStacks for 2:25pm.

## 2014-08-14 ENCOUNTER — Encounter: Payer: Self-pay | Admitting: Family Medicine

## 2014-08-14 ENCOUNTER — Ambulatory Visit (INDEPENDENT_AMBULATORY_CARE_PROVIDER_SITE_OTHER): Payer: Medicare Other | Admitting: Family Medicine

## 2014-08-14 VITALS — BP 138/74 | HR 89 | Temp 97.0°F | Ht 64.5 in | Wt 141.0 lb

## 2014-08-14 DIAGNOSIS — E038 Other specified hypothyroidism: Secondary | ICD-10-CM | POA: Diagnosis not present

## 2014-08-14 LAB — POCT CBC
Granulocyte percent: 65.7 %G (ref 37–80)
HCT, POC: 37.4 % — AB (ref 43.5–53.7)
Hemoglobin: 11.8 g/dL — AB (ref 14.1–18.1)
LYMPH, POC: 1.9 (ref 0.6–3.4)
MCH: 28.8 pg (ref 27–31.2)
MCHC: 31.5 g/dL — AB (ref 31.8–35.4)
MCV: 91.5 fL (ref 80–97)
MPV: 7.5 fL (ref 0–99.8)
POC Granulocyte: 5 (ref 2–6.9)
POC LYMPH PERCENT: 24.9 %L (ref 10–50)
Platelet Count, POC: 187 10*3/uL (ref 142–424)
RBC: 4.09 M/uL — AB (ref 4.69–6.13)
RDW, POC: 13.4 %
WBC: 7.6 10*3/uL (ref 4.6–10.2)

## 2014-08-14 MED ORDER — VITAMIN D3 25 MCG (1000 UT) PO CAPS: 1000.0000 [IU] | ORAL_CAPSULE | Freq: Every day | ORAL | Status: AC

## 2014-08-14 MED ORDER — TEMAZEPAM 15 MG PO CAPS: 15.0000 mg | ORAL_CAPSULE | Freq: Every day | ORAL | Status: AC

## 2014-08-14 MED ORDER — TAMSULOSIN HCL 0.4 MG PO CAPS: 0.4000 mg | ORAL_CAPSULE | Freq: Every day | ORAL | Status: AC

## 2014-08-14 MED ORDER — LEVOTHYROXINE SODIUM 50 MCG PO TABS: 50.0000 ug | ORAL_TABLET | Freq: Every day | ORAL | Status: AC

## 2014-08-14 MED ORDER — FERROUS FUMARATE 324 (106 FE) MG PO TABS: 324.0000 mg | ORAL_TABLET | Freq: Every day | ORAL | Status: AC

## 2014-08-14 MED ORDER — METOPROLOL SUCCINATE ER 100 MG PO TB24: 50.0000 mg | ORAL_TABLET | Freq: Every day | ORAL | Status: AC

## 2014-08-14 NOTE — Patient Instructions (Signed)
Use debrox drops daily. Lay with the affected ear side up. Place 4-5 drops in the ear canal. Lay still, allowing the drops to soak in for 15 min. Then turn your head upright and allow the excess to drain from the ear. Do this daily for 7 days to clear out excess wax. Then weekly thereafter to keep the wax cleared.

## 2014-08-14 NOTE — Progress Notes (Signed)
Subjective:  Patient ID: Justin Bowman, male    DOB: 10-Sep-1925  Age: 81 y.o. MRN: [id number]  CC: Fatigue   HPI Justin Bowman presents for follow-up on  thyroid. She has a history of hypothyroidism for many years. It has been stable recently. Pt. denies any change in  voice, loss of hair, heat or cold intolerance. Energy level has been adequate to good. She denies constipation and diarrhea. No myxedema. Medication is as noted below. Verified that pt is taking it daily on an empty stomach. Well tolerated.  istory Justin Bowman has a past medical history of Other and unspecified hyperlipidemia; ASCVD (arteriosclerotic cardiovascular disease); Other specified cardiac dysrhythmias(427.89); Hemorrhage of gastrointestinal tract, unspecified; Chronic kidney disease, unspecified; Iron deficiency anemia; Hyperplasia of prostate; and Inguinal hernia without mention of obstruction or gangrene, unilateral or unspecified, (not specified as recurrent).   He has no past surgical history on file.   His family history is not on file.He reports that he quit smoking about 12 years ago. His smoking use included Cigarettes. He started smoking about 80 years ago. He smoked 1.00 pack per day. He does not have any smokeless tobacco history on file. He reports that he drinks alcohol. His drug history is not on file.  Current Outpatient Prescriptions on File Prior to Visit  Medication Sig Dispense Refill  . amiodarone (PACERONE) 200 MG tablet Take 200 mg by mouth daily.      Marland Kitchen amLODipine (NORVASC) 10 MG tablet Take 10 mg by mouth daily.      Marland Kitchen aspirin EC 81 MG tablet Take 81 mg by mouth daily.    Marland Kitchen docusate sodium (COLACE) 100 MG capsule Take 100 mg by mouth as needed.      . pravastatin (PRAVACHOL) 80 MG tablet Take 40 mg by mouth daily.      Marland Kitchen warfarin (COUMADIN) 2.5 MG tablet Take 1 tablet (2.5 mg total) by mouth daily. 60 tablet 1  . cloNIDine (CATAPRES) 0.1 MG tablet Take 0.1 mg by mouth 2 (two) times daily.        No current facility-administered medications on file prior to visit.    ROS Review of Systems  Constitutional: Negative for fever, chills and diaphoresis.  HENT: Negative for congestion, rhinorrhea and sore throat.   Respiratory: Negative for cough, shortness of breath and wheezing.   Cardiovascular: Negative for chest pain.  Gastrointestinal: Negative for nausea, vomiting, abdominal pain, diarrhea, constipation and abdominal distention.  Genitourinary: Negative for dysuria and frequency.  Musculoskeletal: Negative for joint swelling and arthralgias.  Skin: Negative for rash.  Neurological: Negative for headaches.    Objective:  BP 138/74 mmHg  Pulse 89  Temp(Src) 97 F (36.1 C) (Oral)  Ht 5' 4.5" (1.638 m)  Wt 141 lb (63.957 kg)  BMI 23.84 kg/m2  BP Readings from Last 3 Encounters:  08/14/14 138/74  05/05/14 122/70  03/30/14 123/69    Wt Readings from Last 3 Encounters:  08/14/14 141 lb (63.957 kg)  05/05/14 146 lb (66.225 kg)  03/30/14 144 lb (65.318 kg)     Physical Exam  Constitutional: He is oriented to person, place, and time. He appears well-developed and well-nourished. No distress.  HENT:  Head: Normocephalic and atraumatic.  Right Ear: External ear normal.  Left Ear: External ear normal.  Nose: Nose normal.  Mouth/Throat: Oropharynx is clear and moist.  Eyes: Conjunctivae and EOM are normal. Pupils are equal, round, and reactive to light.  Neck: Normal range of motion. Neck supple. No  thyromegaly present.  Cardiovascular: Normal rate, regular rhythm and normal heart sounds.   No murmur heard. Pulmonary/Chest: Effort normal and breath sounds normal. No respiratory distress. He has no wheezes. He has no rales.  Abdominal: Soft. Bowel sounds are normal. He exhibits no distension. There is no tenderness.  Lymphadenopathy:    He has no cervical adenopathy.  Neurological: He is alert and oriented to person, place, and time. He has normal reflexes.    Skin: Skin is warm and dry.  Psychiatric: He has a normal mood and affect. His behavior is normal. Judgment and thought content normal.    No results found for: HGBA1C  Lab Results  Component Value Date   WBC 7.6 08/14/2014   HGB 11.8* 08/14/2014   HCT 37.4* 08/14/2014   GLUCOSE 104* 08/14/2014   CHOL 126 11/25/2012   TRIG 106 11/25/2012   HDL 38* 11/25/2012   LDLCALC 67 11/25/2012   ALT 13 08/14/2014   AST 18 08/14/2014   NA 144 08/14/2014   K 4.7 08/14/2014   CL 107 08/14/2014   CREATININE 1.53* 08/14/2014   BUN 15 08/14/2014   CO2 20 08/14/2014   TSH 2.876 07/22/2012   INR 1.6 08/25/2014    No results found.  Assessment & Plan:   Justin Bowman was seen today for fatigue.  Diagnoses and all orders for this visit:  Other specified hypothyroidism Orders: -     POCT CBC -     CMP14+EGFR -     T4, free  Other orders -     Ferrous Fumarate 324 (106 FE) MG TABS; Take 324 mg by mouth daily. -     levothyroxine (SYNTHROID, LEVOTHROID) 50 MCG tablet; Take 1 tablet (50 mcg total) by mouth daily. -     metoprolol succinate (TOPROL-XL) 100 MG 24 hr tablet; Take 1 tablet (100 mg total) by mouth daily. -     Cholecalciferol (VITAMIN D3) 1000 UNITS CAPS; Take 1 capsule (1,000 Units total) by mouth daily. -     tamsulosin (FLOMAX) 0.4 MG CAPS capsule; Take 1 capsule (0.4 mg total) by mouth daily. -     temazepam (RESTORIL) 15 MG capsule; Take 1 capsule (15 mg total) by mouth at bedtime. For sleep   I have discontinued Justin Bowman's cholecalciferol and Iron. I have also changed his metoprolol succinate and temazepam. Additionally, I am having him start on Ferrous Fumarate, levothyroxine, Vitamin D3, and tamsulosin. Lastly, I am having him maintain his amiodarone, amLODipine, cloNIDine, pravastatin, docusate sodium, aspirin EC, and warfarin.  Meds ordered this encounter  Medications  . DISCONTD: temazepam (RESTORIL) 7.5 MG capsule    Sig: Take 15 mg by mouth daily. For sleep  .  Ferrous Fumarate 324 (106 FE) MG TABS    Sig: Take 324 mg by mouth daily.    Dispense:  30 tablet    Refill:  11  . levothyroxine (SYNTHROID, LEVOTHROID) 50 MCG tablet    Sig: Take 1 tablet (50 mcg total) by mouth daily.    Dispense:  90 tablet    Refill:  3  . metoprolol succinate (TOPROL-XL) 100 MG 24 hr tablet    Sig: Take 1 tablet (100 mg total) by mouth daily.    Dispense:  30 tablet    Refill:  5  . Cholecalciferol (VITAMIN D3) 1000 UNITS CAPS    Sig: Take 1 capsule (1,000 Units total) by mouth daily.    Dispense:  30 capsule    Refill:  11  .  tamsulosin (FLOMAX) 0.4 MG CAPS capsule    Sig: Take 1 capsule (0.4 mg total) by mouth daily.    Dispense:  30 capsule    Refill:  3  . temazepam (RESTORIL) 15 MG capsule    Sig: Take 1 capsule (15 mg total) by mouth at bedtime. For sleep    Dispense:  30 capsule    Refill:  2     Follow-up: Return in about 6 weeks (around 09/25/2014).  Claretta Fraise, M.D.

## 2014-08-15 LAB — CMP14+EGFR
ALBUMIN: 4.1 g/dL (ref 3.5–4.7)
ALT: 13 IU/L (ref 0–44)
AST: 18 IU/L (ref 0–40)
Albumin/Globulin Ratio: 1.6 (ref 1.1–2.5)
Alkaline Phosphatase: 65 IU/L (ref 39–117)
BUN/Creatinine Ratio: 10 (ref 10–22)
BUN: 15 mg/dL (ref 8–27)
Bilirubin Total: 0.3 mg/dL (ref 0.0–1.2)
CALCIUM: 8.7 mg/dL (ref 8.6–10.2)
CHLORIDE: 107 mmol/L (ref 97–108)
CO2: 20 mmol/L (ref 18–29)
CREATININE: 1.53 mg/dL — AB (ref 0.76–1.27)
GFR, EST AFRICAN AMERICAN: 47 mL/min/{1.73_m2} — AB (ref >59)
GFR, EST NON AFRICAN AMERICAN: 41 mL/min/{1.73_m2} — AB (ref >59)
GLOBULIN, TOTAL: 2.6 g/dL (ref 1.5–4.5)
Glucose: 104 mg/dL — ABNORMAL HIGH (ref 65–99)
Potassium: 4.7 mmol/L (ref 3.5–5.2)
Sodium: 144 mmol/L (ref 134–144)
Total Protein: 6.7 g/dL (ref 6.0–8.5)

## 2014-08-15 LAB — T4, FREE: Free T4: 1.49 ng/dL (ref 0.82–1.77)

## 2014-08-25 ENCOUNTER — Ambulatory Visit (INDEPENDENT_AMBULATORY_CARE_PROVIDER_SITE_OTHER): Payer: Medicare Other | Admitting: Pharmacist Clinician (PhC)/ Clinical Pharmacy Specialist

## 2014-08-25 DIAGNOSIS — I482 Chronic atrial fibrillation, unspecified: Secondary | ICD-10-CM

## 2014-08-25 LAB — POCT INR: INR: 1.6

## 2014-08-25 NOTE — Patient Instructions (Addendum)
  Anticoagulation Dose Instructions as of 08/25/2014      Glynis SmilesSun Mon Tue Wed Thu Fri Sat   New Dose 2.5 mg 2.5 mg 5 mg 2.5 mg 2.5 mg 5 mg 2.5 mg    Description        Take 3 tablets today and 2 tablets tomorrow.  Thursday resume regular schedule.

## 2014-09-01 ENCOUNTER — Encounter: Payer: Self-pay | Admitting: Family Medicine

## 2014-09-01 ENCOUNTER — Ambulatory Visit (INDEPENDENT_AMBULATORY_CARE_PROVIDER_SITE_OTHER): Payer: Medicare Other | Admitting: Family Medicine

## 2014-09-01 ENCOUNTER — Ambulatory Visit (INDEPENDENT_AMBULATORY_CARE_PROVIDER_SITE_OTHER): Payer: Medicare Other

## 2014-09-01 VITALS — BP 143/74 | HR 74 | Temp 97.4°F | Ht 64.5 in | Wt 138.0 lb

## 2014-09-01 DIAGNOSIS — R531 Weakness: Secondary | ICD-10-CM

## 2014-09-01 DIAGNOSIS — R450 Nervousness: Secondary | ICD-10-CM | POA: Diagnosis not present

## 2014-09-01 DIAGNOSIS — R251 Tremor, unspecified: Secondary | ICD-10-CM

## 2014-09-01 DIAGNOSIS — I480 Paroxysmal atrial fibrillation: Secondary | ICD-10-CM | POA: Diagnosis not present

## 2014-09-01 DIAGNOSIS — I2510 Atherosclerotic heart disease of native coronary artery without angina pectoris: Secondary | ICD-10-CM | POA: Diagnosis not present

## 2014-09-01 LAB — POCT UA - MICROSCOPIC ONLY
Bacteria, U Microscopic: NEGATIVE
CASTS, UR, LPF, POC: NEGATIVE
Crystals, Ur, HPF, POC: NEGATIVE
RBC, urine, microscopic: NEGATIVE
Yeast, UA: NEGATIVE

## 2014-09-01 LAB — POCT CBC
Granulocyte percent: 75.8 %G (ref 37–80)
HEMATOCRIT: 37.6 % — AB (ref 43.5–53.7)
Hemoglobin: 12.1 g/dL — AB (ref 14.1–18.1)
Lymph, poc: 1.4 (ref 0.6–3.4)
MCH, POC: 30.1 pg (ref 27–31.2)
MCHC: 32.2 g/dL (ref 31.8–35.4)
MCV: 93.6 fL (ref 80–97)
MPV: 7 fL (ref 0–99.8)
POC GRANULOCYTE: 5.7 (ref 2–6.9)
POC LYMPH %: 18.4 % (ref 10–50)
Platelet Count, POC: 203 10*3/uL (ref 142–424)
RBC: 4.02 M/uL — AB (ref 4.69–6.13)
RDW, POC: 13.3 %
WBC: 7.5 10*3/uL (ref 4.6–10.2)

## 2014-09-01 LAB — POCT URINALYSIS DIPSTICK
Bilirubin, UA: NEGATIVE
Blood, UA: NEGATIVE
Glucose, UA: NEGATIVE
Ketones, UA: NEGATIVE
NITRITE UA: NEGATIVE
SPEC GRAV UA: 1.02
Urobilinogen, UA: NEGATIVE
pH, UA: 5
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
## 2014-09-02 LAB — THYROID PANEL WITH TSH
Free Thyroxine Index: 2.6 (ref 1.2–4.9)
T3 Uptake Ratio: 29 % (ref 24–39)
T4 TOTAL: 8.9 ug/dL (ref 4.5–12.0)
TSH: 4.91 u[IU]/mL — ABNORMAL HIGH (ref 0.450–4.500)

## 2014-09-02 LAB — BMP8+EGFR
BUN / CREAT RATIO: 13 (ref 10–22)
BUN: 21 mg/dL (ref 8–27)
CALCIUM: 9.2 mg/dL (ref 8.6–10.2)
CO2: 22 mmol/L (ref 18–29)
Chloride: 106 mmol/L (ref 97–108)
Creatinine, Ser: 1.66 mg/dL — ABNORMAL HIGH (ref 0.76–1.27)
GFR calc Af Amer: 43 mL/min/{1.73_m2} — ABNORMAL LOW (ref >59)
GFR calc non Af Amer: 37 mL/min/{1.73_m2} — ABNORMAL LOW (ref >59)
Glucose: 104 mg/dL — ABNORMAL HIGH (ref 65–99)
Potassium: 4.8 mmol/L (ref 3.5–5.2)
Sodium: 141 mmol/L (ref 134–144)

## 2014-09-03 LAB — URINE CULTURE

## 2014-09-04 ENCOUNTER — Other Ambulatory Visit: Payer: Self-pay | Admitting: *Deleted

## 2014-09-04 MED ORDER — AMOXICILLIN-POT CLAVULANATE 875-125 MG PO TABS: 1.0000 | ORAL_TABLET | Freq: Two times a day (BID) | ORAL | Status: DC

## 2014-09-25 ENCOUNTER — Ambulatory Visit: Payer: Medicare Other | Admitting: Family Medicine

## 2014-10-02 ENCOUNTER — Other Ambulatory Visit: Payer: Self-pay | Admitting: *Deleted

## 2014-10-02 ENCOUNTER — Telehealth: Payer: Self-pay | Admitting: Family Medicine

## 2014-10-02 MED ORDER — WARFARIN SODIUM 2.5 MG PO TABS: 2.5000 mg | ORAL_TABLET | Freq: Every day | ORAL | Status: DC

## 2014-10-07 ENCOUNTER — Ambulatory Visit (INDEPENDENT_AMBULATORY_CARE_PROVIDER_SITE_OTHER): Payer: Medicare Other | Admitting: Pharmacist

## 2014-10-07 DIAGNOSIS — I482 Chronic atrial fibrillation, unspecified: Secondary | ICD-10-CM

## 2014-10-07 DIAGNOSIS — I480 Paroxysmal atrial fibrillation: Secondary | ICD-10-CM

## 2014-10-07 LAB — POCT INR: INR: 3

## 2014-10-07 MED ORDER — WARFARIN SODIUM 2.5 MG PO TABS: 2.5000 mg | ORAL_TABLET | Freq: Every day | ORAL | Status: AC

## 2014-10-07 NOTE — Patient Instructions (Signed)
Anticoagulation Dose Instructions as of 10/07/2014      Justin SmilesSun Mon Tue Wed Thu Fri Sat   New Dose 2.5 mg 2.5 mg 5 mg 2.5 mg 2.5 mg 5 mg 2.5 mg    Description        No warfarin today, then restart regular schedule of 2.5mg  1 tablet daily except 2 tablets on tuesdays and fridays.      INR was 3.0 today

## 2014-11-04 ENCOUNTER — Ambulatory Visit (INDEPENDENT_AMBULATORY_CARE_PROVIDER_SITE_OTHER): Payer: Medicare Other | Admitting: Pharmacist

## 2014-11-04 VITALS — BP 130/62 | HR 66

## 2014-11-04 DIAGNOSIS — I10 Essential (primary) hypertension: Secondary | ICD-10-CM

## 2014-11-04 DIAGNOSIS — E038 Other specified hypothyroidism: Secondary | ICD-10-CM

## 2014-11-04 DIAGNOSIS — I482 Chronic atrial fibrillation, unspecified: Secondary | ICD-10-CM

## 2014-11-04 DIAGNOSIS — Z79899 Other long term (current) drug therapy: Secondary | ICD-10-CM | POA: Diagnosis not present

## 2014-11-04 DIAGNOSIS — I480 Paroxysmal atrial fibrillation: Secondary | ICD-10-CM | POA: Diagnosis not present

## 2014-11-04 DIAGNOSIS — E032 Hypothyroidism due to medicaments and other exogenous substances: Secondary | ICD-10-CM | POA: Insufficient documentation

## 2014-11-04 DIAGNOSIS — T462X1A Poisoning by other antidysrhythmic drugs, accidental (unintentional), initial encounter: Secondary | ICD-10-CM

## 2014-11-04 LAB — POCT INR: INR: 1.5

## 2014-11-04 NOTE — Progress Notes (Signed)
Subjective:     Indication: atrial fibrillation Bleeding signs/symptoms: None Thromboembolic signs/symptoms: None  Missed Coumadin doses: None Medication changes: yes - patient stopped amlodipine because his home BP was low and he has feeling fatigued.  He has stopped levothyroxine because he thought it was causing fatigue Dietary changes: no Bacterial/viral infection: no Other concerns: no  Objective:    INR Today: 1.5 Current dose: warfarin 2.5mg  - 2 tablets =  on tuesdays and fridays and 1 tablet = 2.5mg  all other days.  Assessment:    Subtherapeutic INR for goal of 2-3  HTN Fatigue  Hypothyroidism Medication management  Plan:    1. New dose: patient to take 2 tablets today, then restart usual dose of  on tuesdays and fridays and 2.5mg  all other days.   2. Next INR: 2 weeks   3.  Remain off amlodipine 4.  Restart levothyroxine 1 tablet daily 5.  Scheduled to recheck labs in 1 month at [facility] hospital  Henrene Pastor, PharmD, CPP

## 2014-11-04 NOTE — Patient Instructions (Signed)
Anticoagulation Dose Instructions as of 11/04/2014      Justin Bowman Tue Wed Thu Fri Sat   New Dose 2.5 mg 2.5 mg 5 mg 2.5 mg 2.5 mg 5 mg 2.5 mg    Description        Take 2 tablets today - Wednesday, August 3rd.  Then restart regular schedule of 2.5mg  1 tablet daily except 2 tablets on tuesdays and fridays.     INR was 1.5 today  Remain off amlodipine Restart levothyroxine - take 1 tablet daily for thyroid replacement.

## 2014-11-24 ENCOUNTER — Ambulatory Visit (INDEPENDENT_AMBULATORY_CARE_PROVIDER_SITE_OTHER): Payer: Medicare Other | Admitting: Pharmacist Clinician (PhC)/ Clinical Pharmacy Specialist

## 2014-11-24 DIAGNOSIS — I482 Chronic atrial fibrillation, unspecified: Secondary | ICD-10-CM

## 2014-11-24 DIAGNOSIS — E038 Other specified hypothyroidism: Secondary | ICD-10-CM

## 2014-11-24 LAB — POCT INR: INR: 3.2

## 2014-11-25 LAB — THYROID PANEL WITH TSH
FREE THYROXINE INDEX: 2.5 (ref 1.2–4.9)
T3 Uptake Ratio: 30 % (ref 24–39)
T4, Total: 8.2 ug/dL (ref 4.5–12.0)
TSH: 4.99 u[IU]/mL — AB (ref 0.450–4.500)

## 2015-01-01 ENCOUNTER — Ambulatory Visit (INDEPENDENT_AMBULATORY_CARE_PROVIDER_SITE_OTHER): Payer: Medicare Other

## 2015-01-01 DIAGNOSIS — Z23 Encounter for immunization: Secondary | ICD-10-CM

## 2015-01-05 ENCOUNTER — Ambulatory Visit (INDEPENDENT_AMBULATORY_CARE_PROVIDER_SITE_OTHER): Payer: Medicare Other | Admitting: Pharmacist Clinician (PhC)/ Clinical Pharmacy Specialist

## 2015-01-05 DIAGNOSIS — I482 Chronic atrial fibrillation, unspecified: Secondary | ICD-10-CM

## 2015-01-05 LAB — POCT INR: INR: 2.2

## 2015-01-05 NOTE — Progress Notes (Signed)
Patient stopped amiodarone about 2 months ago because of not feeling well and low blood pressure.  Home readings are still staying 110/55.  He has lost 10 lbs.  Patient feels tired.  Today BP is elevated at 180/78 on two different checks.  HR is normal at 80.  He feels best today patient states when BP is elevated.  He did go back on 1/2 amiodarone a day [facility].  Patient states he takes only 1/2 of a metoprolol  a day and has been doing this for several years.

## 2015-02-16 ENCOUNTER — Ambulatory Visit (INDEPENDENT_AMBULATORY_CARE_PROVIDER_SITE_OTHER): Payer: Medicare Other | Admitting: Pharmacist Clinician (PhC)/ Clinical Pharmacy Specialist

## 2015-02-16 DIAGNOSIS — I482 Chronic atrial fibrillation, unspecified: Secondary | ICD-10-CM

## 2015-02-16 DIAGNOSIS — I4891 Unspecified atrial fibrillation: Secondary | ICD-10-CM | POA: Diagnosis not present

## 2015-02-16 LAB — POCT INR: INR: 2

## 2015-04-06 ENCOUNTER — Ambulatory Visit (INDEPENDENT_AMBULATORY_CARE_PROVIDER_SITE_OTHER): Payer: Medicare Other | Admitting: Pharmacist

## 2015-04-06 ENCOUNTER — Encounter: Payer: Self-pay | Admitting: Pharmacist

## 2015-04-06 VITALS — BP 112/62 | HR 78 | Ht 65.0 in | Wt 140.0 lb

## 2015-04-06 DIAGNOSIS — I482 Chronic atrial fibrillation, unspecified: Secondary | ICD-10-CM

## 2015-04-06 DIAGNOSIS — I4891 Unspecified atrial fibrillation: Secondary | ICD-10-CM

## 2015-04-06 LAB — POCT INR: INR: 1.7

## 2015-04-06 NOTE — Patient Instructions (Signed)
Anticoagulation Dose Instructions as of 04/06/2015      Justin SmilesSun Mon Tue Wed Thu Fri Sat   New Dose 2.5 mg 2.5 mg 5 mg 2.5 mg 2.5 mg 5 mg 2.5 mg    Description        Take 2 tablets today - Tuesday, January 3rd and tomorrow - Wednesday, January 4th.  Then resume regular schedule of 2 tablets on tuesdays and fridays and 1 tablet all other days.

## 2015-05-05 DEATH — deceased

## 2015-05-11 ENCOUNTER — Encounter: Payer: Self-pay | Admitting: Pharmacist Clinician (PhC)/ Clinical Pharmacy Specialist

## 2015-06-12 IMAGING — CR DG CHEST 2V
2 series · 2 of 2 positions shown · non-contrast
Comparison: July 22, 2012

CLINICAL DATA: Generalized weakness

EXAM:
CHEST  2 VIEW

[view not recorded (1 of 2)]
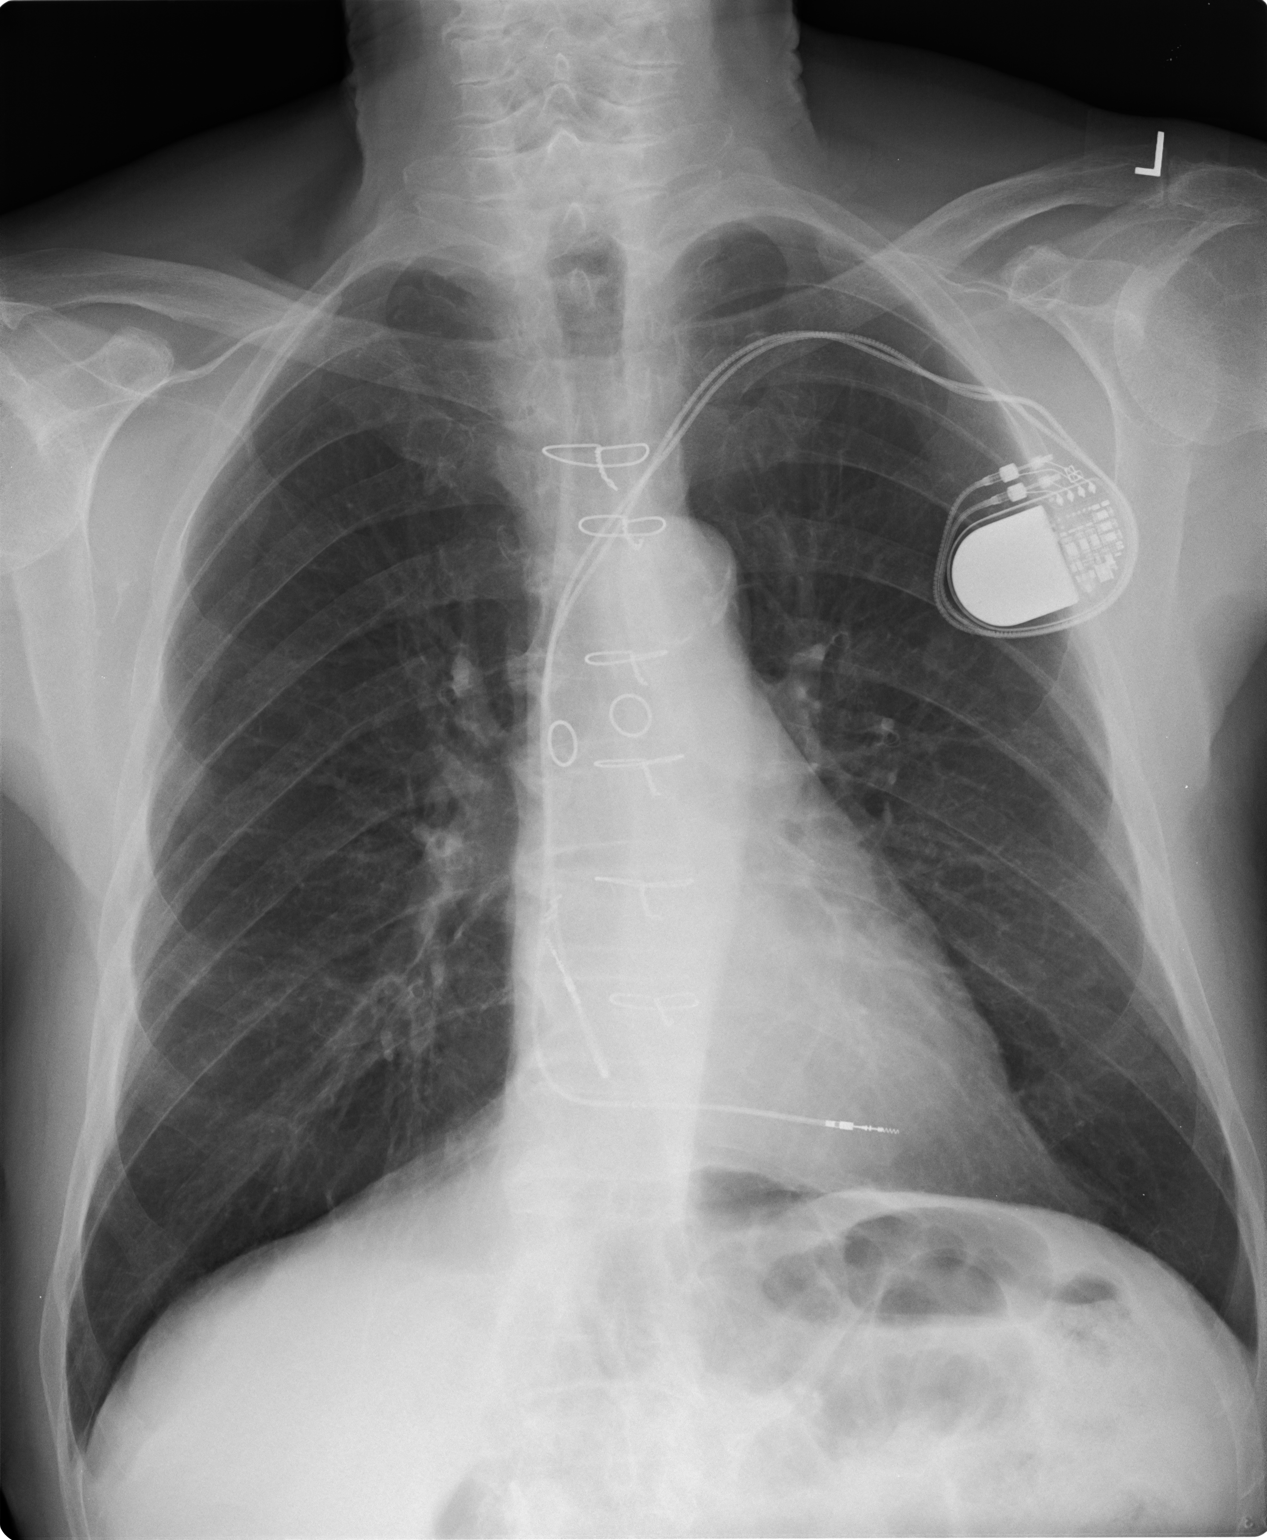

[view not recorded (2 of 2)]
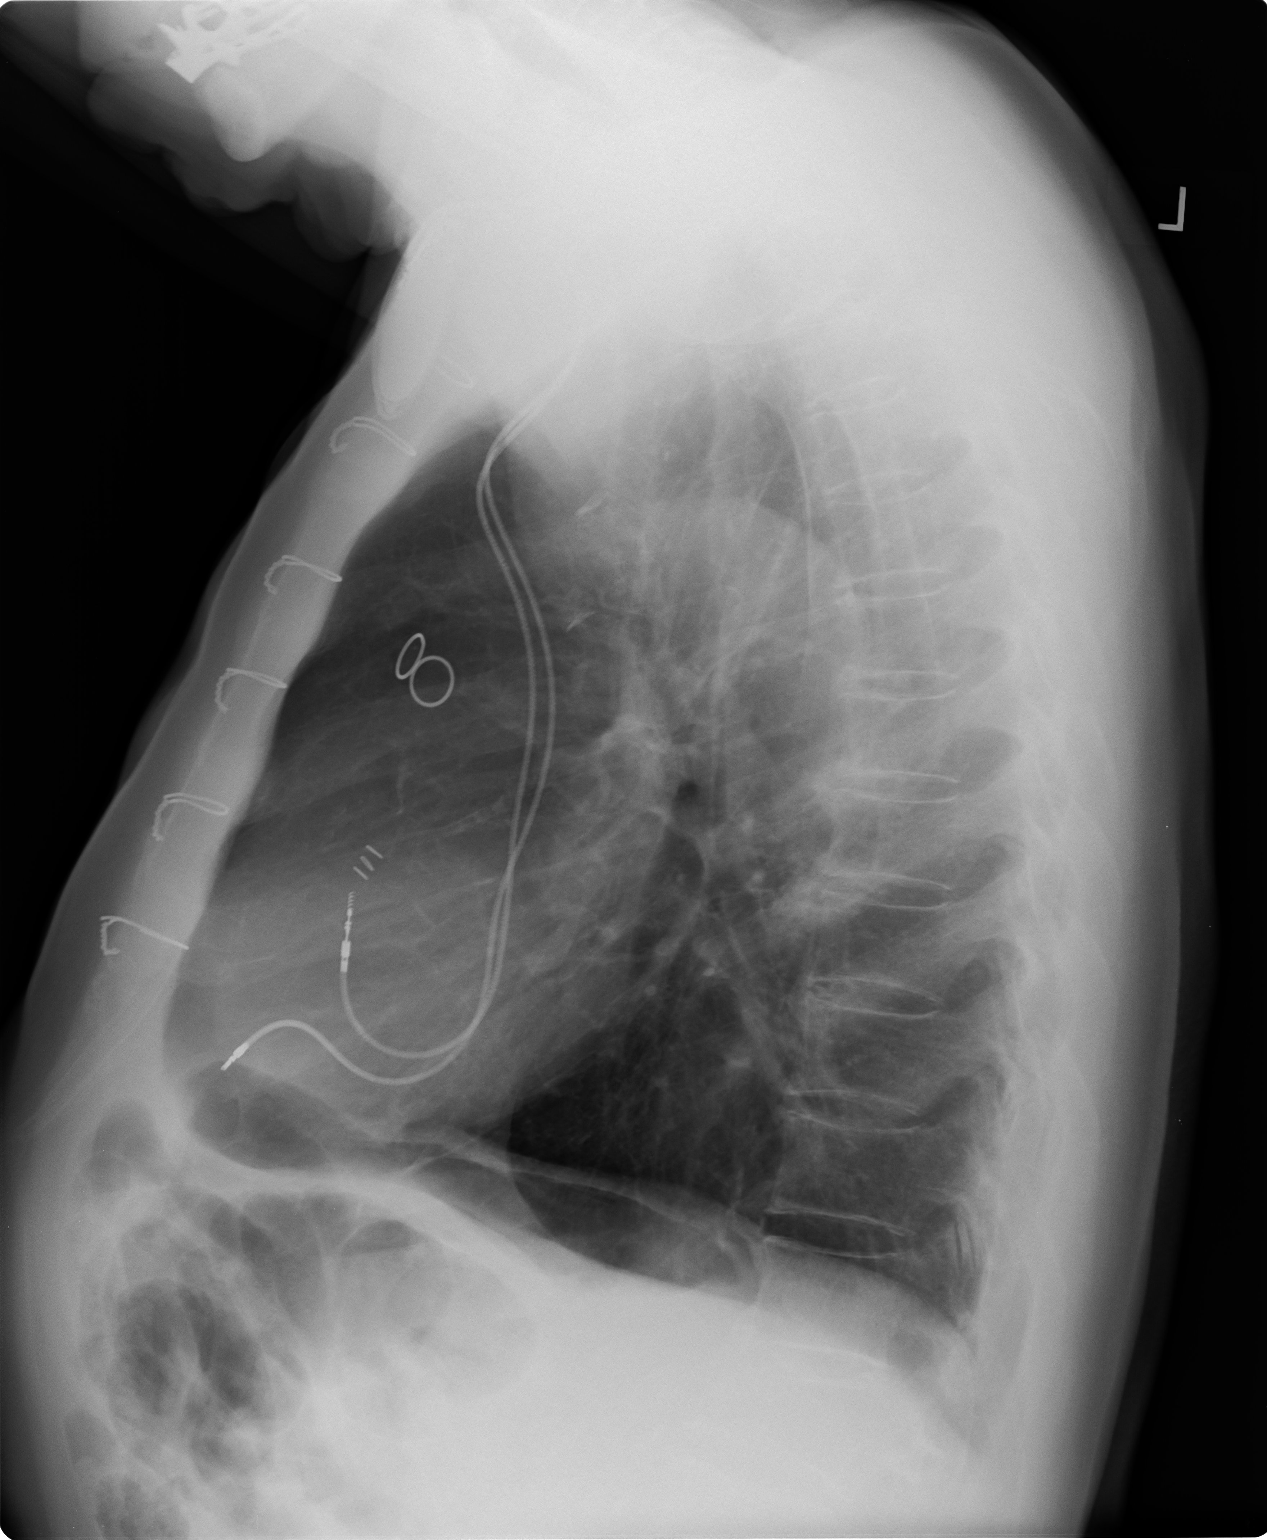

[2 of 2 positions shown; findings below may reference images not displayed]

FINDINGS: There is no edema or consolidation. Lungs are mildly hyperexpanded.
The heart size is normal. Pulmonary vascular is within limits.
Pacemaker leads are attached to the right atrium and right
ventricle. Patient is status post coronary artery bypass grafting.
No adenopathy. No bone lesions.
IMPRESSION: No edema or consolidation.
